# Patient Record
Sex: Female | Born: 1975 | Hispanic: Yes | Marital: Married
Health system: Midwestern US, Academic
[De-identification: ages and names within clinical notes are randomized; demographics above are authoritative.]

---

## 2016-08-12 ENCOUNTER — Encounter: Admit: 2016-08-12 | Discharge: 2016-08-13

## 2016-08-12 DIAGNOSIS — R69 Illness, unspecified: Principal | ICD-10-CM

## 2016-08-23 ENCOUNTER — Encounter: Admit: 2016-08-23 | Discharge: 2016-08-23

## 2016-09-06 ENCOUNTER — Encounter: Admit: 2016-09-06 | Discharge: 2016-09-06

## 2016-09-06 ENCOUNTER — Ambulatory Visit: Admit: 2016-09-06 | Discharge: 2016-09-06

## 2016-09-06 DIAGNOSIS — J45909 Unspecified asthma, uncomplicated: Principal | ICD-10-CM

## 2016-09-06 DIAGNOSIS — N83202 Unspecified ovarian cyst, left side: Principal | ICD-10-CM

## 2016-09-06 DIAGNOSIS — C55 Malignant neoplasm of uterus, part unspecified: ICD-10-CM

## 2016-09-06 NOTE — Progress Notes
Date of Service: 09/06/2016    Subjective:             Cassidy Wyatt is a 41 y.o. female presenting for new gyn consult due to ovarian cyst    History of Present Illness     Patient presents for evaluation of left ovarian cyst.  Reports she starting having some cramping pain about a month ago which led to her getting a transvaginal ultrasound with [facility].  This ultra sound was done on 7/5 and showed  A complex left adnexal cy t with vascularity. Measured 2.3x1.9x1,8 cm.  Reports cramping still present but is less.  Did have hysterectomy for cervical cancer at [facility] followed by [facility] for right ovarian cyst        Review of Systems   Constitutional: Positive for fatigue. Negative for fever and unexpected weight change.   HENT: Negative for voice change.    Respiratory: Negative for cough and shortness of breath.    Cardiovascular: Negative for chest pain and leg swelling.   Gastrointestinal: Negative for abdominal pain, blood in stool, constipation, diarrhea, nausea and vomiting.   Genitourinary: Positive for pelvic pain. Negative for difficulty urinating, dyspareunia, dysuria, enuresis, frequency, genital sores, hematuria, menstrual problem, urgency, vaginal bleeding, vaginal discharge and vaginal pain.   Musculoskeletal: Negative for arthralgias and back pain.   Skin: Negative for rash.   Neurological: Negative for light-headedness and headaches.   Hematological: Negative for adenopathy. Does not bruise/bleed easily.   Psychiatric/Behavioral: Negative for confusion. The patient is nervous/anxious.      OB History   Gravida Para Term Preterm AB Living   2 2 2     2    SAB TAB Ectopic Multiple Live Births           2      # Outcome Date GA Lbr Len/2nd Weight Sex Delivery Anes PTL Lv   2 Term            1 Term                 Gyn: Normal pap , neg HPV in April 2018.  Reports history of cervical cancer    Past Medical History:   Diagnosis Date   ??? Asthma    ??? Uterine cancer (HCC) Past Surgical History:   Procedure Laterality Date   ??? HX COLPOSCOPY     ??? HX HYSTERECTOMY       2015: right ovary and tube, cyst  2013: reports cancer cells on pap smear. [facility] .  Had hysterectomy.    Social History     Social History   ??? Marital status: Married     Spouse name: N/A   ??? Number of children: N/A   ??? Years of education: N/A     Occupational History   ??? Not on file.     Social History Main Topics   ??? Smoking status: Never Smoker   ??? Smokeless tobacco: Never Used   ??? Alcohol use No   ??? Drug use: No   ??? Sexual activity: Yes     Partners: Male     Birth control/ protection: None     Other Topics Concern   ??? Not on file     Social History Narrative   ??? No narrative on file       .      Objective:         No current outpatient prescriptions on file.  Vitals:    09/06/16 1506   BP: 130/76   Pulse: 68   Weight: 69.6 kg (153 lb 6.4 oz)   Height: 162.6 cm (64)     Body mass index is 26.33 kg/m???.     Physical Exam   Physical Exam   Constitutional: She is oriented to person, place, and time. She appears well-developed and well-nourished.   Head: Normocephalic.   Neck: Normal range of motion.   Cardiovascular: Normal rate.    Pulmonary/Chest: Effort normal.   Musculoskeletal: Normal range of motion.   Neurological: She is alert and oriented to person, place, and time.   Psychiatric: She has a normal mood and affect.   Abd: soft, nontender, nondistended  GU: vaginal cuff intact, no nodularity. Normal appearing vaginal mucosa and cuff .  No adnexal fullness or tenderness. Not able to palpate cyst          Assessment and Plan:  41 yo with adnexal mass    -Will acquire records from [place] regarding h/o cervical cancer and h/o right oophorectomy, cyst pathology  -Repeat sono in  Weeks to assess for cyst resolution. Patient strongly desires repeat ultrasound at [facility] due to cost, will have result faxed    Discussed with Dr. Vivien Presto, MD  Obstetrics and Gynecology PGY4  [phone]

## 2016-09-07 ENCOUNTER — Encounter: Admit: 2016-09-07 | Discharge: 2016-09-07

## 2016-09-07 NOTE — Telephone Encounter
Records requested from [facility].

## 2016-09-16 ENCOUNTER — Encounter: Admit: 2016-09-16 | Discharge: 2016-09-17

## 2016-09-16 DIAGNOSIS — R69 Illness, unspecified: Principal | ICD-10-CM

## 2016-09-27 NOTE — Progress Notes
Attending Attestation   I have reviewed the history and assessment of the patient, [place] by Dr. Noel Gerold, MD.  We have discussed the diagnosis and treatment plan together.  Phebe Colla, MD

## 2016-10-28 ENCOUNTER — Encounter: Admit: 2016-10-28 | Discharge: 2016-10-29

## 2016-10-28 DIAGNOSIS — R69 Illness, unspecified: Principal | ICD-10-CM

## 2016-11-03 ENCOUNTER — Encounter: Admit: 2016-11-03 | Discharge: 2016-11-03

## 2016-11-03 DIAGNOSIS — R69 Illness, unspecified: Principal | ICD-10-CM

## 2016-11-22 ENCOUNTER — Ambulatory Visit: Admit: 2016-11-22 | Discharge: 2016-11-22

## 2016-11-22 ENCOUNTER — Encounter: Admit: 2016-11-22 | Discharge: 2016-11-22

## 2016-11-22 DIAGNOSIS — C55 Malignant neoplasm of uterus, part unspecified: ICD-10-CM

## 2016-11-22 DIAGNOSIS — N83202 Unspecified ovarian cyst, left side: Principal | ICD-10-CM

## 2016-11-22 DIAGNOSIS — J45909 Unspecified asthma, uncomplicated: Principal | ICD-10-CM

## 2016-11-30 ENCOUNTER — Encounter: Admit: 2016-11-30 | Discharge: 2016-11-30

## 2016-11-30 NOTE — Telephone Encounter
Records requested Cassidy Wyatt

## 2017-01-07 ENCOUNTER — Encounter: Admit: 2017-01-07 | Discharge: 2017-01-07

## 2017-01-07 NOTE — Telephone Encounter
Records on disc received from [facility]; placed in med records to be scanned to chart.
